# Patient Record
Sex: Female | Born: 1990 | Race: White | Hispanic: No | Marital: Married | Smoking: Never smoker
Health system: Southern US, Community
[De-identification: ages and names within clinical notes are randomized; demographics above are authoritative.]

## PROBLEM LIST (undated history)

## (undated) DIAGNOSIS — Z789 Other specified health status: Secondary | ICD-10-CM

## (undated) HISTORY — PX: THERAPEUTIC ABORTION: SHX798

## (undated) HISTORY — DX: Other specified health status: Z78.9

## (undated) HISTORY — PX: WISDOM TOOTH EXTRACTION: SHX21

---

## 2010-01-16 ENCOUNTER — Observation Stay (HOSPITAL_COMMUNITY)
Admission: AD | Admit: 2010-01-16 | Discharge: 2010-01-16 | Payer: Self-pay | Source: Home / Self Care | Admitting: Obstetrics and Gynecology

## 2010-03-02 ENCOUNTER — Inpatient Hospital Stay (HOSPITAL_COMMUNITY)
Admission: AD | Admit: 2010-03-02 | Discharge: 2010-03-04 | Payer: Self-pay | Source: Home / Self Care | Attending: Obstetrics and Gynecology | Admitting: Obstetrics and Gynecology

## 2010-05-18 LAB — CBC
HCT: 35 % — ABNORMAL LOW (ref 36.0–46.0)
HCT: 37.6 % (ref 36.0–46.0)
MCH: 28.6 pg (ref 26.0–34.0)
MCHC: 34.8 g/dL (ref 30.0–36.0)
MCV: 83.4 fL (ref 78.0–100.0)
MCV: 85 fL (ref 78.0–100.0)
Platelets: 158 10*3/uL (ref 150–400)
RBC: 4.12 MIL/uL (ref 3.87–5.11)
RDW: 12.8 % (ref 11.5–15.5)
WBC: 13.7 10*3/uL — ABNORMAL HIGH (ref 4.0–10.5)

## 2013-01-05 LAB — OB RESULTS CONSOLE GC/CHLAMYDIA
CHLAMYDIA, DNA PROBE: NEGATIVE
GC PROBE AMP, GENITAL: NEGATIVE

## 2013-01-05 LAB — OB RESULTS CONSOLE ABO/RH: RH Type: POSITIVE

## 2013-01-05 LAB — OB RESULTS CONSOLE RPR: RPR: NONREACTIVE

## 2013-01-05 LAB — OB RESULTS CONSOLE HEPATITIS B SURFACE ANTIGEN: Hepatitis B Surface Ag: NEGATIVE

## 2013-01-05 LAB — OB RESULTS CONSOLE HIV ANTIBODY (ROUTINE TESTING): HIV: NONREACTIVE

## 2013-01-05 LAB — OB RESULTS CONSOLE RUBELLA ANTIBODY, IGM: Rubella: IMMUNE

## 2013-01-05 LAB — OB RESULTS CONSOLE ANTIBODY SCREEN: ANTIBODY SCREEN: NEGATIVE

## 2013-03-08 NOTE — L&D Delivery Note (Signed)
Delivery Note At 3:18 PM a viable and healthy female was delivered via Vaginal, Spontaneous Delivery (Presentation: OA; LOT ).  APGAR: 8, 8; weight P.   Placenta status: Intact, Spontaneous.  Cord: 3 vessels with the following complications: none.    Anesthesia: Epidural  Episiotomy: none Lacerations: B labial Suture Repair: 3.0 vicryl rapide Est. Blood Loss (mL): 400cc  Mom to postpartum.  Baby to Couplet care / Skin to Skin.  Chrisoula Zegarra Bovard-Stuckert 07/02/2013, 3:35 PM  A+/Br/Contra?Idamae Lusher/RNI

## 2013-06-21 ENCOUNTER — Other Ambulatory Visit (HOSPITAL_COMMUNITY): Payer: Self-pay | Admitting: Obstetrics and Gynecology

## 2013-06-21 DIAGNOSIS — R52 Pain, unspecified: Secondary | ICD-10-CM

## 2013-06-22 ENCOUNTER — Ambulatory Visit (HOSPITAL_COMMUNITY)
Admission: RE | Admit: 2013-06-22 | Discharge: 2013-06-22 | Disposition: A | Discharge status: Home / Self Care | Payer: Medicaid Other | Source: Ambulatory Visit | Attending: Obstetrics and Gynecology | Admitting: Obstetrics and Gynecology

## 2013-06-22 DIAGNOSIS — R109 Unspecified abdominal pain: Secondary | ICD-10-CM | POA: Insufficient documentation

## 2013-06-22 DIAGNOSIS — O99891 Other specified diseases and conditions complicating pregnancy: Secondary | ICD-10-CM | POA: Insufficient documentation

## 2013-06-22 DIAGNOSIS — R52 Pain, unspecified: Secondary | ICD-10-CM

## 2013-06-22 DIAGNOSIS — O9989 Other specified diseases and conditions complicating pregnancy, childbirth and the puerperium: Principal | ICD-10-CM

## 2013-06-28 ENCOUNTER — Encounter (HOSPITAL_COMMUNITY): Payer: Self-pay | Admitting: *Deleted

## 2013-06-28 ENCOUNTER — Telehealth (HOSPITAL_COMMUNITY): Payer: Self-pay | Admitting: *Deleted

## 2013-06-28 LAB — OB RESULTS CONSOLE GBS: GBS: NEGATIVE

## 2013-06-28 NOTE — Telephone Encounter (Signed)
Preadmission screen  

## 2013-06-30 DIAGNOSIS — Z3480 Encounter for supervision of other normal pregnancy, unspecified trimester: Secondary | ICD-10-CM

## 2013-06-30 NOTE — H&P (Signed)
Ana Montoya is a 23 y.o. female G2P1001 at 39+ for IOL given term and favorable cervix. +FM< no LOF, no VB, occ ctx.  Relatively uncomplicated PNC RNI - needs MMR PP, somewhat late entry to care 14wk, [place] cwd.  +FM, no LOF, no VB, occ ctx.  D/w pt r/b/a, wishes to proceed . Maternal Medical History:  Contractions: Frequency: irregular.    Fetal activity: Perceived fetal activity is normal.    Prenatal complications: no prenatal complications Prenatal Complications - Diabetes: none.    OB History   Grav Para Term Preterm Abortions TAB SAB Ect Mult Living   3 1 1  1 1    1     G1 TAB G2 7#6, SVD female G3 presenr No abn pap No STD  Past Medical History  Diagnosis Date  . Medical history non-contributory    Past Surgical History  Procedure Laterality Date  . Wisdom tooth extraction    . Therapeutic abortion     Family History: family history is negative for Alcohol abuse, Arthritis, Asthma, Birth defects, Cancer, COPD, Depression, Diabetes, Drug abuse, Early death, Hearing loss, Heart disease, Hyperlipidemia, Hypertension, Kidney disease, Learning disabilities, Mental illness, Mental retardation, Miscarriages / Stillbirths, Stroke, Vision loss, and Varicose Veins. Social History:  reports that she has never smoked. She has never used smokeless tobacco. She reports that she does not drink alcohol or use illicit drugs.married  Meds PNV All NKDA   Prenatal Transfer Tool  Maternal Diabetes: No Genetic Screening: Normal Maternal Ultrasounds/Referrals: Normal Fetal Ultrasounds or other Referrals:  None Maternal Substance Abuse:  No Significant Maternal Medications:  None Significant Maternal Lab Results:  Lab values include: Group B Strep negative Other Comments:  None  Review of Systems  Constitutional: Negative.   HENT: Negative.   Eyes: Negative.   Respiratory: Negative.   Cardiovascular: Negative.   Gastrointestinal: Negative.   Genitourinary: Negative.    Musculoskeletal: Negative.   Skin: Negative.   Neurological: Negative.   Psychiatric/Behavioral: Negative.       Last menstrual period 10/08/2012. Maternal Exam:  Uterine Assessment: Contraction frequency is irregular.   Abdomen: Fundal height is appropriate for gestation.   Estimated fetal weight is 7.5-8.5#.   Fetal presentation: vertex  Introitus: Normal vulva. Normal vagina.  Pelvis: adequate for delivery.   Cervix: Cervix evaluated by digital exam.     Physical Exam  Constitutional: She is oriented to person, place, and time. She appears well-developed and well-nourished.  HENT:  Head: Normocephalic and atraumatic.  Cardiovascular: Normal rate and regular rhythm.   Respiratory: Effort normal and breath sounds normal. No respiratory distress. She has no wheezes.  GI: Soft. Bowel sounds are normal. She exhibits no distension. There is no tenderness.  Musculoskeletal: Normal range of motion.  Neurological: She is alert and oriented to person, place, and time.  Skin: Skin is warm and dry.  Psychiatric: She has a normal mood and affect. Her behavior is normal.    Prenatal labs: ABO, Rh: A/Positive/-- (10/31 0000) Antibody: Negative (10/31 0000) Rubella: Immune (10/31 0000) RPR: Nonreactive (10/31 0000)  HBsAg: Negative (10/31 0000)  HIV: Non-reactive (10/31 0000)  GBS: Negative (04/23 0000)   Hgb 13.9/Ur Cx neg/GC neg/ Chl neg/ RNI/Pap WNL/ AFP WNL/glucola 82/Plt 247K  Tdap 04/11/13, Flu 01/2013  Nl anat, postplac, female RUQ US WNL Assessment/Plan: 23yo G2P1001 at 39+ for IOL gbbs neg Epidural prn IOL with AROM/pitocin Expect SVD   Kiamesha Samet Bovard-Stuckert 06/30/2013, 10:37 PM

## 2013-07-02 ENCOUNTER — Inpatient Hospital Stay (HOSPITAL_COMMUNITY): Payer: Medicaid Other | Admitting: Anesthesiology

## 2013-07-02 ENCOUNTER — Encounter (HOSPITAL_COMMUNITY): Payer: Medicaid Other | Admitting: Anesthesiology

## 2013-07-02 ENCOUNTER — Inpatient Hospital Stay (HOSPITAL_COMMUNITY)
Admission: RE | Admit: 2013-07-02 | Discharge: 2013-07-03 | DRG: 775 | Disposition: A | Discharge status: Home / Self Care | Payer: Medicaid Other | Source: Ambulatory Visit | Attending: Obstetrics and Gynecology | Admitting: Obstetrics and Gynecology

## 2013-07-02 ENCOUNTER — Encounter (HOSPITAL_COMMUNITY): Payer: Self-pay

## 2013-07-02 VITALS — BP 110/73 | HR 75 | Temp 98.2°F | Resp 18 | Ht 68.0 in | Wt 181.0 lb

## 2013-07-02 DIAGNOSIS — Z3480 Encounter for supervision of other normal pregnancy, unspecified trimester: Secondary | ICD-10-CM

## 2013-07-02 LAB — CBC
HCT: 37 % (ref 36.0–46.0)
HEMOGLOBIN: 12.8 g/dL (ref 12.0–15.0)
MCH: 29.1 pg (ref 26.0–34.0)
MCHC: 34.6 g/dL (ref 30.0–36.0)
MCV: 84.1 fL (ref 78.0–100.0)
Platelets: 171 10*3/uL (ref 150–400)
RBC: 4.4 MIL/uL (ref 3.87–5.11)
RDW: 12.9 % (ref 11.5–15.5)
WBC: 9.9 10*3/uL (ref 4.0–10.5)

## 2013-07-02 LAB — RPR

## 2013-07-02 MED ORDER — LANOLIN HYDROUS EX OINT: TOPICAL_OINTMENT | CUTANEOUS | Status: DC | PRN

## 2013-07-02 MED ORDER — ACETAMINOPHEN 325 MG PO TABS: 650.0000 mg | ORAL_TABLET | ORAL | Status: DC | PRN

## 2013-07-02 MED ORDER — ONDANSETRON HCL 4 MG/2ML IJ SOLN: 4.0000 mg | Freq: Four times a day (QID) | INTRAMUSCULAR | Status: DC | PRN

## 2013-07-02 MED ORDER — OXYTOCIN 40 UNITS IN LACTATED RINGERS INFUSION - SIMPLE MED: 62.5000 mL/h | INTRAVENOUS | Status: DC

## 2013-07-02 MED ORDER — ZOLPIDEM TARTRATE 5 MG PO TABS: 5.0000 mg | ORAL_TABLET | Freq: Every evening | ORAL | Status: DC | PRN

## 2013-07-02 MED ORDER — LIDOCAINE HCL (PF) 1 % IJ SOLN
30.0000 mL | INTRAMUSCULAR | Status: DC | PRN
  Administered 2013-07-02: 30 mL via SUBCUTANEOUS
  Filled 2013-07-02: qty 30

## 2013-07-02 MED ORDER — DIBUCAINE 1 % RE OINT: 1.0000 "application " | TOPICAL_OINTMENT | RECTAL | Status: DC | PRN

## 2013-07-02 MED ORDER — LIDOCAINE HCL (PF) 1 % IJ SOLN
INTRAMUSCULAR | Status: DC | PRN
  Administered 2013-07-02 (×2): 9 mL

## 2013-07-02 MED ORDER — EPHEDRINE 5 MG/ML INJ
10.0000 mg | INTRAVENOUS | Status: DC | PRN
  Filled 2013-07-02: qty 2

## 2013-07-02 MED ORDER — EPHEDRINE 5 MG/ML INJ
10.0000 mg | INTRAVENOUS | Status: DC | PRN
  Filled 2013-07-02: qty 4
  Filled 2013-07-02: qty 2

## 2013-07-02 MED ORDER — TETANUS-DIPHTH-ACELL PERTUSSIS 5-2.5-18.5 LF-MCG/0.5 IM SUSP: 0.5000 mL | Freq: Once | INTRAMUSCULAR | Status: DC

## 2013-07-02 MED ORDER — BUTORPHANOL TARTRATE 1 MG/ML IJ SOLN
1.0000 mg | INTRAMUSCULAR | Status: DC | PRN
Start: 2013-07-02 — End: 2013-07-02

## 2013-07-02 MED ORDER — IBUPROFEN 600 MG PO TABS
600.0000 mg | ORAL_TABLET | Freq: Four times a day (QID) | ORAL | Status: DC
  Administered 2013-07-02 – 2013-07-03 (×3): 600 mg via ORAL
  Filled 2013-07-02 (×3): qty 1

## 2013-07-02 MED ORDER — OXYCODONE-ACETAMINOPHEN 5-325 MG PO TABS: 1.0000 | ORAL_TABLET | ORAL | Status: DC | PRN

## 2013-07-02 MED ORDER — MEASLES, MUMPS & RUBELLA VAC ~~LOC~~ INJ: 0.5000 mL | INJECTION | Freq: Once | SUBCUTANEOUS | Status: DC

## 2013-07-02 MED ORDER — IBUPROFEN 600 MG PO TABS
600.0000 mg | ORAL_TABLET | Freq: Four times a day (QID) | ORAL | Status: DC | PRN
  Administered 2013-07-02: 600 mg via ORAL
  Filled 2013-07-02: qty 1

## 2013-07-02 MED ORDER — PHENYLEPHRINE 40 MCG/ML (10ML) SYRINGE FOR IV PUSH (FOR BLOOD PRESSURE SUPPORT)
80.0000 ug | PREFILLED_SYRINGE | INTRAVENOUS | Status: DC | PRN
  Filled 2013-07-02: qty 2
  Filled 2013-07-02: qty 10

## 2013-07-02 MED ORDER — PRENATAL MULTIVITAMIN CH
1.0000 | ORAL_TABLET | Freq: Every day | ORAL | Status: DC
  Administered 2013-07-03: 1 via ORAL
  Filled 2013-07-02: qty 1

## 2013-07-02 MED ORDER — OXYTOCIN BOLUS FROM INFUSION: 500.0000 mL | INTRAVENOUS | Status: DC

## 2013-07-02 MED ORDER — CITRIC ACID-SODIUM CITRATE 334-500 MG/5ML PO SOLN: 30.0000 mL | ORAL | Status: DC | PRN

## 2013-07-02 MED ORDER — SENNOSIDES-DOCUSATE SODIUM 8.6-50 MG PO TABS
2.0000 | ORAL_TABLET | ORAL | Status: DC
  Administered 2013-07-02: 2 via ORAL
  Filled 2013-07-02: qty 2

## 2013-07-02 MED ORDER — PHENYLEPHRINE 40 MCG/ML (10ML) SYRINGE FOR IV PUSH (FOR BLOOD PRESSURE SUPPORT)
80.0000 ug | PREFILLED_SYRINGE | INTRAVENOUS | Status: DC | PRN
  Filled 2013-07-02: qty 2

## 2013-07-02 MED ORDER — BENZOCAINE-MENTHOL 20-0.5 % EX AERO
1.0000 "application " | INHALATION_SPRAY | CUTANEOUS | Status: DC | PRN
  Administered 2013-07-02: 1 via TOPICAL
  Filled 2013-07-02: qty 56

## 2013-07-02 MED ORDER — DIPHENHYDRAMINE HCL 25 MG PO CAPS: 25.0000 mg | ORAL_CAPSULE | Freq: Four times a day (QID) | ORAL | Status: DC | PRN

## 2013-07-02 MED ORDER — ONDANSETRON HCL 4 MG/2ML IJ SOLN: 4.0000 mg | INTRAMUSCULAR | Status: DC | PRN

## 2013-07-02 MED ORDER — TERBUTALINE SULFATE 1 MG/ML IJ SOLN: 0.2500 mg | Freq: Once | INTRAMUSCULAR | Status: DC | PRN

## 2013-07-02 MED ORDER — LACTATED RINGERS IV SOLN: INTRAVENOUS | Status: DC

## 2013-07-02 MED ORDER — FENTANYL 2.5 MCG/ML BUPIVACAINE 1/10 % EPIDURAL INFUSION (WH - ANES)
INTRAMUSCULAR | Status: DC | PRN
  Administered 2013-07-02: 14 mL/h via EPIDURAL

## 2013-07-02 MED ORDER — ONDANSETRON HCL 4 MG PO TABS: 4.0000 mg | ORAL_TABLET | ORAL | Status: DC | PRN

## 2013-07-02 MED ORDER — LACTATED RINGERS IV SOLN: 500.0000 mL | Freq: Once | INTRAVENOUS | Status: DC

## 2013-07-02 MED ORDER — WITCH HAZEL-GLYCERIN EX PADS
1.0000 | MEDICATED_PAD | CUTANEOUS | Status: DC | PRN
Start: 2013-07-02 — End: 2013-07-03

## 2013-07-02 MED ORDER — OXYTOCIN 40 UNITS IN LACTATED RINGERS INFUSION - SIMPLE MED
1.0000 m[IU]/min | INTRAVENOUS | Status: DC
  Administered 2013-07-02: 2 m[IU]/min via INTRAVENOUS
  Filled 2013-07-02: qty 1000

## 2013-07-02 MED ORDER — SIMETHICONE 80 MG PO CHEW: 80.0000 mg | CHEWABLE_TABLET | ORAL | Status: DC | PRN

## 2013-07-02 MED ORDER — FENTANYL 2.5 MCG/ML BUPIVACAINE 1/10 % EPIDURAL INFUSION (WH - ANES)
14.0000 mL/h | INTRAMUSCULAR | Status: DC | PRN
  Filled 2013-07-02: qty 125

## 2013-07-02 MED ORDER — LACTATED RINGERS IV SOLN
INTRAVENOUS | Status: DC
  Administered 2013-07-02 (×2): via INTRAVENOUS

## 2013-07-02 MED ORDER — DIPHENHYDRAMINE HCL 50 MG/ML IJ SOLN: 12.5000 mg | INTRAMUSCULAR | Status: DC | PRN

## 2013-07-02 MED ORDER — LACTATED RINGERS IV SOLN: 500.0000 mL | INTRAVENOUS | Status: DC | PRN

## 2013-07-02 NOTE — Anesthesia Procedure Notes (Signed)
Epidural Patient location during procedure: OB Start time: 07/02/2013 11:45 AM End time: 07/02/2013 11:49 AM  Staffing Anesthesiologist: Leilani AbleHATCHETT, Zerek Litsey  Preanesthetic Checklist Completed: patient identified, surgical consent, pre-op evaluation, timeout performed, IV checked, risks and benefits discussed and monitors and equipment checked  Epidural Patient position: sitting Prep: site prepped and draped and DuraPrep Patient monitoring: continuous pulse ox and blood pressure Approach: midline Location: L3-L4 Injection technique: LOR air  Needle:  Needle type: Tuohy  Needle gauge: 17 G Needle length: 9 cm and 9 Needle insertion depth: 6 cm Catheter type: closed end flexible Catheter size: 19 Gauge Catheter at skin depth: 11 cm Test dose: negative and Other  Assessment Sensory level: T9 Events: blood not aspirated, injection not painful, no injection resistance, negative IV test and no paresthesia  Additional Notes Reason for block:procedure for pain

## 2013-07-02 NOTE — Progress Notes (Signed)
Patient ID: Ana Montoya, female   DOB: [birth year], [age] y.o.   MRN: [id number]  No c/o's, comfortable with epidural  AFVSS gen NAD FHTs 120's category 1 toco Q 2-553min  SVE per RN 4/70  23yo G2P1001 at 39+ for IOL Continue IOL - pitocin

## 2013-07-02 NOTE — Progress Notes (Signed)
Patient ID: Ana Montoya, female   DOB: 30-Dec-1990, 23 y.o.   MRN: [id number]  No c/o's.  Ready for baby!  AFVSS  gen NAD FHTs 120's, category 1 toco occ, irr  SVE 3/50/-2  AROM for clear fluid w/o diff/comp  23yo G3P1011 at 39+ for IOL Pitocin and AROM,  Expect SVD Will await pitocin if no change in 2 hr

## 2013-07-02 NOTE — Anesthesia Preprocedure Evaluation (Signed)
Anesthesia Evaluation  Patient identified by MRN, date of birth, ID band Patient awake    Reviewed: Allergy & Precautions, H&P , NPO status , Patient's Chart, lab work & pertinent test results  Airway Mallampati: I TM Distance: >3 FB Neck ROM: full    Dental no notable dental hx.    Pulmonary neg pulmonary ROS,    Pulmonary exam normal       Cardiovascular negative cardio ROS  Rhythm:regular     Neuro/Psych negative neurological ROS  negative psych ROS   GI/Hepatic negative GI ROS, Neg liver ROS,   Endo/Other  negative endocrine ROS  Renal/GU negative Renal ROS     Musculoskeletal   Abdominal Normal abdominal exam  (+)   Peds  Hematology negative hematology ROS (+)   Anesthesia Other Findings   Reproductive/Obstetrics (+) Pregnancy                           Anesthesia Physical Anesthesia Plan  ASA: II  Anesthesia Plan: Epidural   Post-op Pain Management:    Induction:   Airway Management Planned:   Additional Equipment:   Intra-op Plan:   Post-operative Plan:   Informed Consent: I have reviewed the patients History and Physical, chart, labs and discussed the procedure including the risks, benefits and alternatives for the proposed anesthesia with the patient or authorized representative who has indicated his/her understanding and acceptance.     Plan Discussed with:   Anesthesia Plan Comments:         Anesthesia Quick Evaluation

## 2013-07-03 LAB — CBC
HCT: 34 % — ABNORMAL LOW (ref 36.0–46.0)
Hemoglobin: 11.6 g/dL — ABNORMAL LOW (ref 12.0–15.0)
MCH: 28.9 pg (ref 26.0–34.0)
MCHC: 34.1 g/dL (ref 30.0–36.0)
MCV: 84.6 fL (ref 78.0–100.0)
PLATELETS: 158 10*3/uL (ref 150–400)
RBC: 4.02 MIL/uL (ref 3.87–5.11)
RDW: 12.9 % (ref 11.5–15.5)
WBC: 11.9 10*3/uL — ABNORMAL HIGH (ref 4.0–10.5)

## 2013-07-03 MED ORDER — IBUPROFEN 800 MG PO TABS: 800.0000 mg | ORAL_TABLET | Freq: Three times a day (TID) | ORAL | Status: AC | PRN

## 2013-07-03 MED ORDER — OXYCODONE-ACETAMINOPHEN 5-325 MG PO TABS: 1.0000 | ORAL_TABLET | Freq: Four times a day (QID) | ORAL | Status: AC | PRN

## 2013-07-03 MED ORDER — PRENATAL MULTIVITAMIN CH: 1.0000 | ORAL_TABLET | Freq: Every day | ORAL | Status: AC

## 2013-07-03 NOTE — Lactation Note (Signed)
This note was copied from the chart of Ana Eda PaschalKelly Stokke. Lactation Consultation Note  Patient Name: Ana Montoya [id number]'s Date: 07/03/2013 Reason for consult: Follow-up assessment;Other (Comment) (Checked latch while baby nursing.) Follow-up assessment of latch. Baby already nursing. Baby's latch appears shallow at first, but both lips are flanged outward, baby has good rhythmic sucking with swallows heard. Mom denies soreness. Enc mom to make sure baby's lower lip is flanged outward and baby has as deep a latch as possible. Enc mom to re-latch if baby slips to the tip. Enc mom to call for assistance as needed.  Maternal Data Formula Feeding for Exclusion: No Has patient been taught Hand Expression?: Yes (Mom reports knowing how.  She was able to explain it.to [facility] Does the patient have breastfeeding experience prior to this delivery?: Yes  Feeding Feeding Type: Breast Fed  LATCH Score/Interventions                      Lactation Tools Discussed/Used     Consult Status Consult Status: Complete Date: 07/04/13    Sherlyn HayJennifer D Chao Blazejewski 07/03/2013, 2:19 PM

## 2013-07-03 NOTE — Progress Notes (Addendum)
Post Partum Day 1 Subjective: no complaints, up ad lib, tolerating PO and nl lochia, pain controlled  Objective: Blood pressure 110/73, pulse 75, temperature 98.2 F (36.8 C), temperature source Oral, resp. rate 18, height 5\' 8"  (1.727 m), weight 82.101 kg (181 lb), last menstrual period 10/08/2012, SpO2 88.00%, unknown if currently breastfeeding.  Physical Exam:  General: alert and no distress Lochia: appropriate Uterine Fundus: firm   Recent Labs  07/02/13 0754 07/03/13 0550  HGB 12.8 11.6*  HCT 37.0 34.0*    Assessment/Plan: Plan for discharge tomorrow, pt desires d/c today, Breastfeeding and Lactation consult.  Routine PP care.  D/C with motrin, percocet, and pnv; f/u 6 weeks.     LOS: 1 day   Annalissa Murphey Bovard-Stuckert 07/03/2013, 7:29 AM

## 2013-07-03 NOTE — Discharge Summary (Signed)
Obstetric Discharge Summary Reason for Admission: induction of labor Prenatal Procedures: none Intrapartum Procedures: spontaneous vaginal delivery Postpartum Procedures: Rubella Ig and Tdap Complications-Operative and Postpartum: B labial laceration Hemoglobin  Date Value Ref Range Status  07/03/2013 11.6* 12.0 - 15.0 g/dL Final     HCT  Date Value Ref Range Status  07/03/2013 34.0* 36.0 - 46.0 % Final    Physical Exam:  General: alert and no distress Lochia: appropriate Uterine Fundus: firm  Discharge Diagnoses: Term Pregnancy-delivered  Discharge Information: Date: 07/03/2013 Activity: pelvic rest Diet: routine Medications: PNV, Ibuprofen and Percocet Condition: stable Instructions: refer to practice specific booklet Discharge to: home Follow-up Information   Follow up with Bovard-Stuckert, Aubriee Szeto, MD. Schedule an appointment as soon as possible for a visit in 6 weeks. (for routine pp care)    Specialty:  Obstetrics and Gynecology   Contact information:   [address] [place] [address] [phone]       Newborn Data: Live born female  Birth Weight: 8 lb 12.7 oz (3990 g) APGAR: 8, 8  Home with mother.  Ana Montoya Bovard-Stuckert 07/03/2013, 7:52 AM

## 2013-07-03 NOTE — Progress Notes (Signed)
UR chart review completed.  

## 2014-01-07 ENCOUNTER — Encounter (HOSPITAL_COMMUNITY): Payer: Self-pay

## 2016-01-13 IMAGING — US US ABDOMEN COMPLETE
1 series · 14 of 25 positions shown · non-contrast
Comparison: None.

CLINICAL DATA: Pain, 38 weeks pregnant

EXAM:
ULTRASOUND ABDOMEN COMPLETE

[Series 1: us abdomen complete · 0.27mm/px · 14 of 65 slices shown]
[im 1/65]
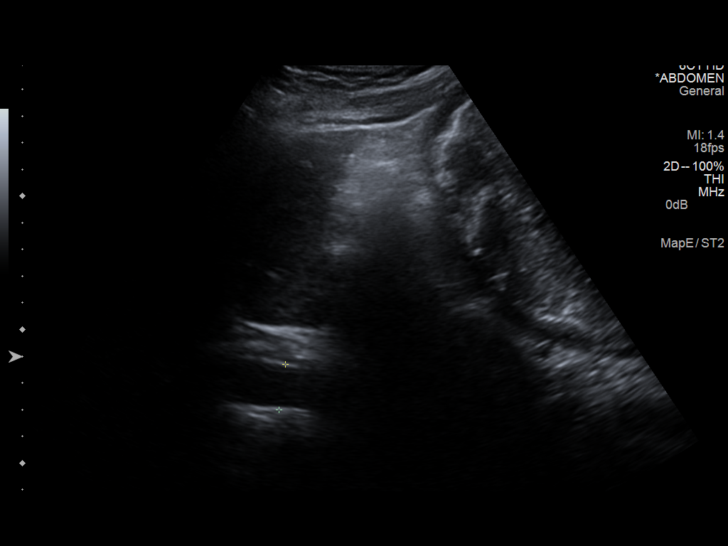
[im 6/65]
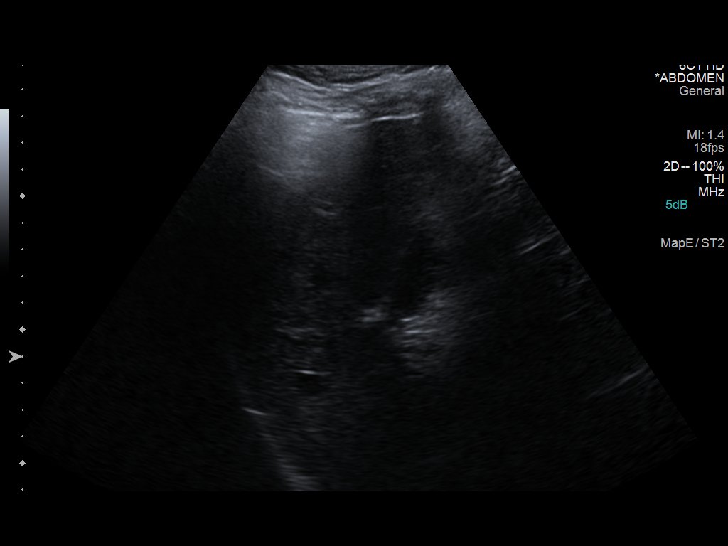
[im 11/65]
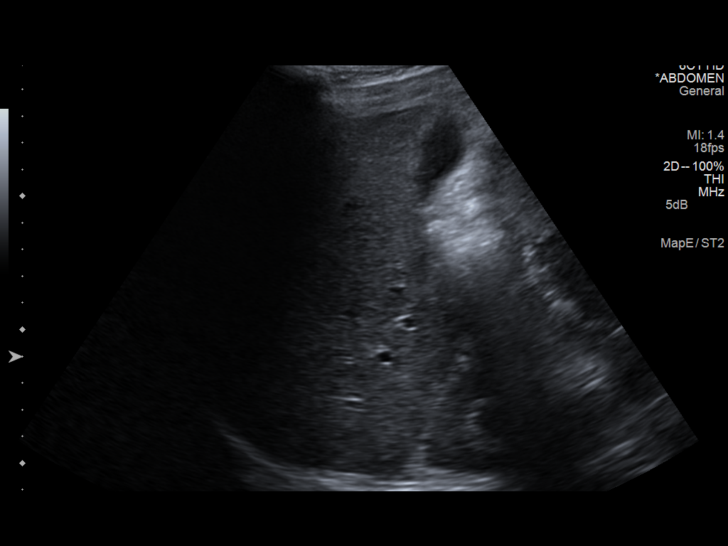
[im 17/65]
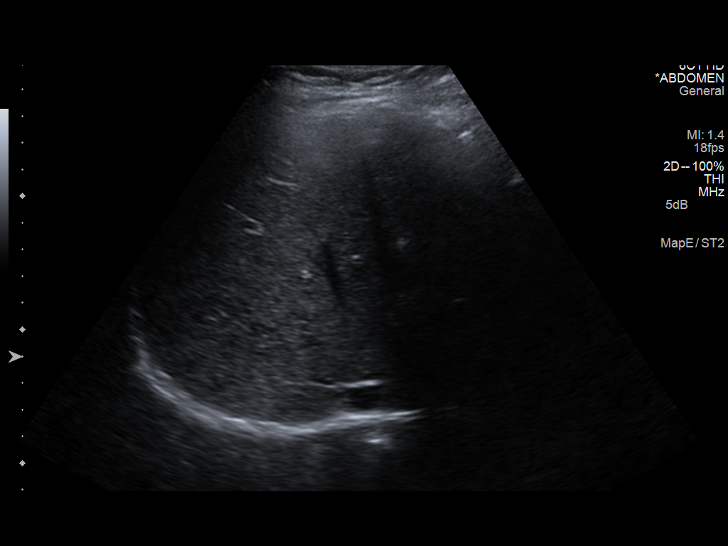
[im 22/65]
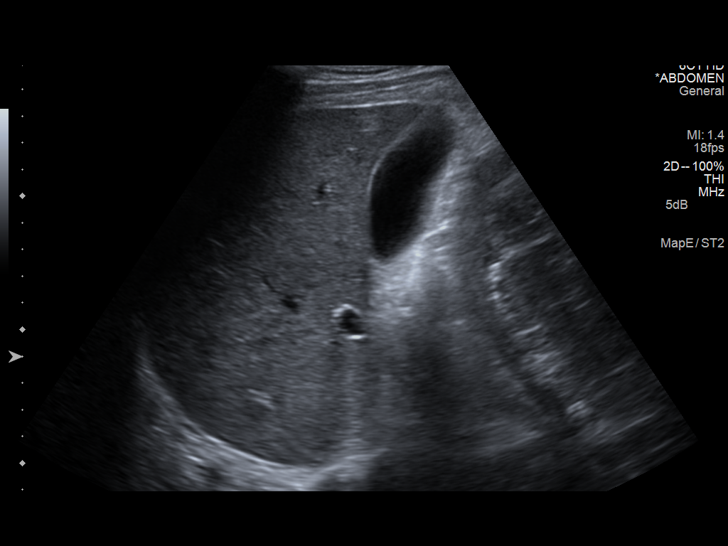
[im 25/65]
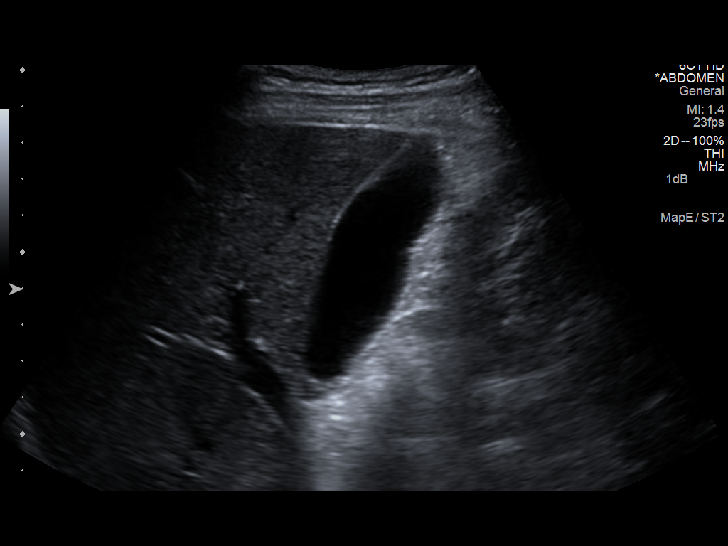
[im 30/65]
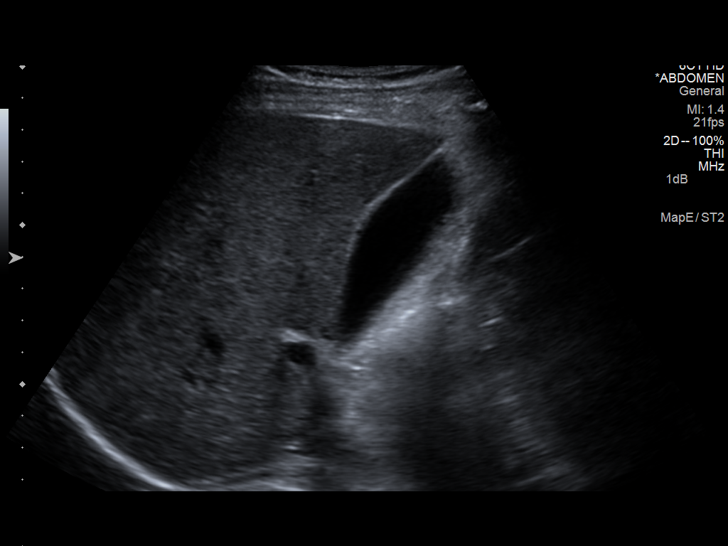
[im 35/65]
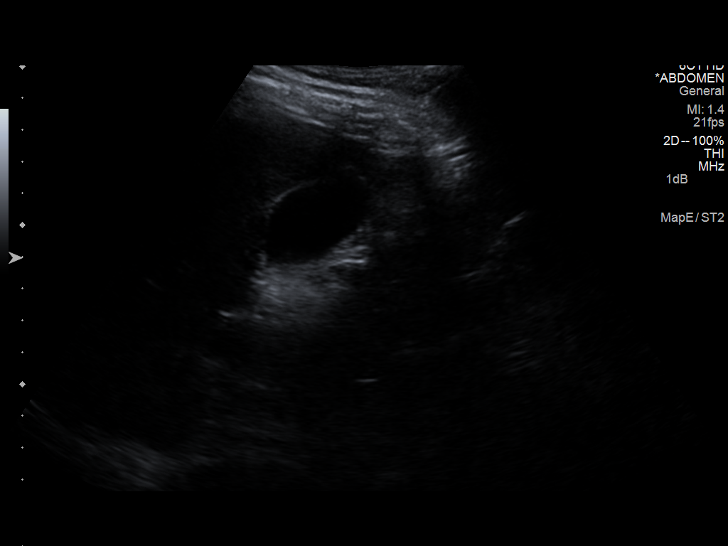
[im 41/65]
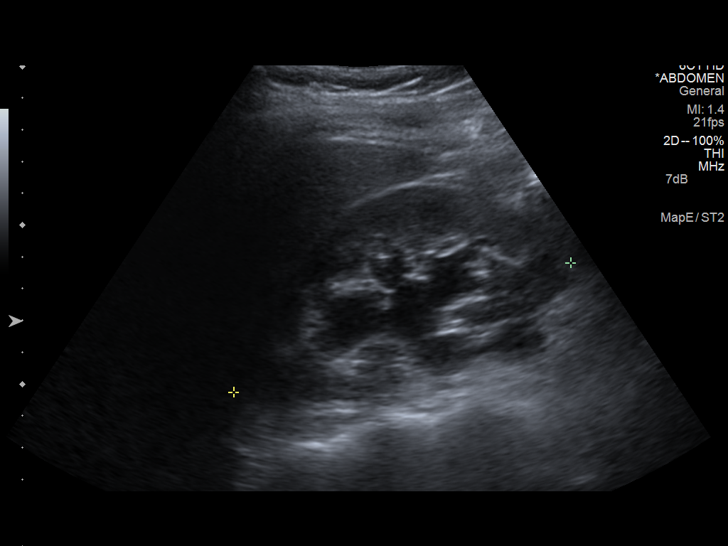
[im 43/65]
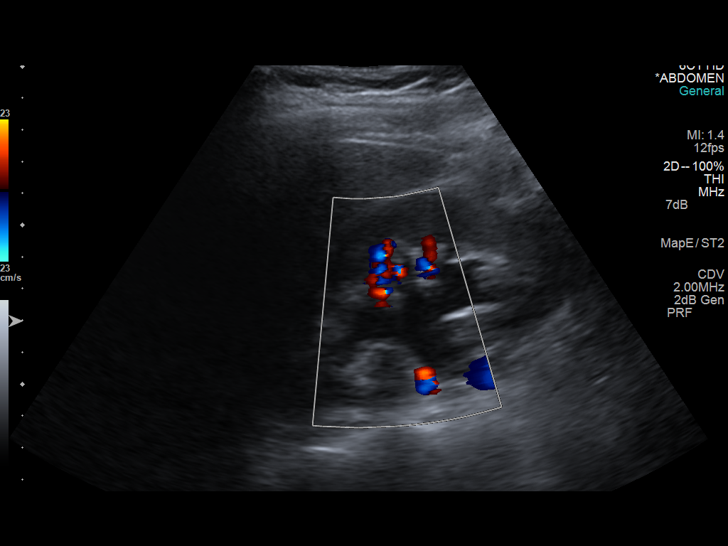
[im 49/65]
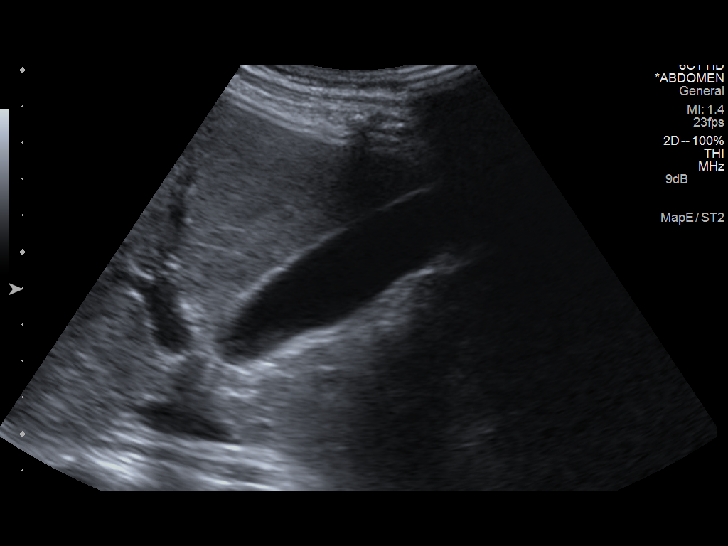
[im 54/65]
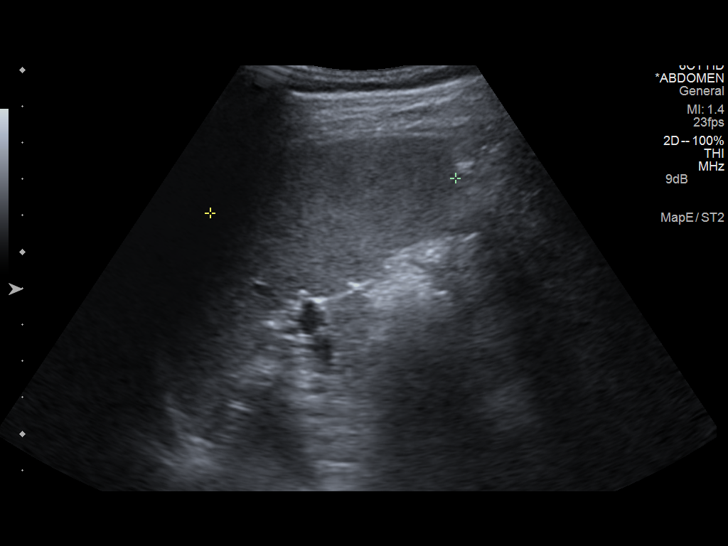
[im 59/65]
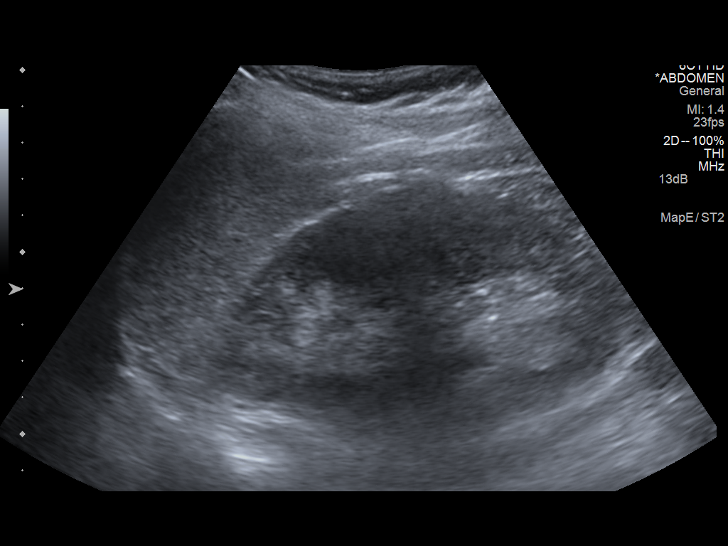
[im 65/65]
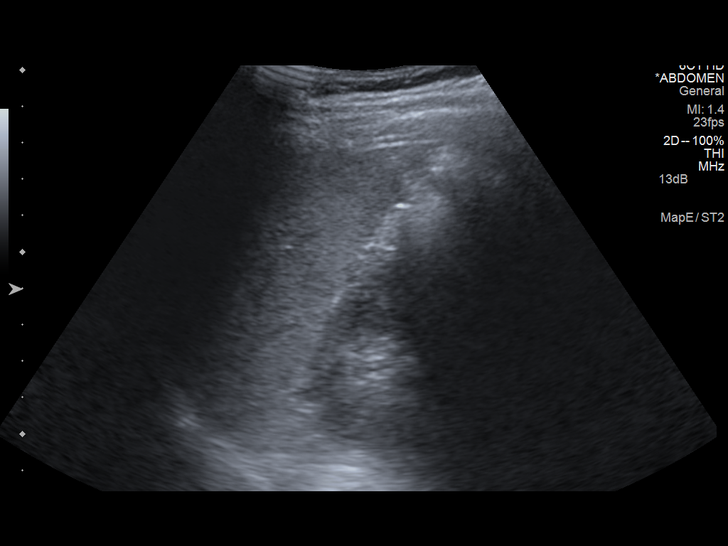

[14 of 25 positions shown; findings below may reference images not displayed]

FINDINGS: Gallbladder:

No gallstones or wall thickening visualized. No sonographic Murphy
sign noted.

Common bile duct:

Diameter: 4 mm in diameter within normal limits.

Liver:

No focal lesion identified. Within normal limits in parenchymal
echogenicity.

IVC:

No abnormality visualized.

Pancreas:

Not seen due to bowel gas

Spleen:

Size and appearance within normal limits. Measures 6.8 cm in length.

Right Kidney:

Length: 11.4 cm. Echogenicity within normal limits. Mild
hydronephrosis. No diagnostic renal calculus

Left Kidney:

Length: 11.4 cm in length. Echogenicity within normal limits. No
mass or hydronephrosis visualized.

Abdominal aorta:

No aneurysm visualized.  Measures up to 1.7 cm in diameter.

Other findings:

None.
IMPRESSION: 1. No gallstones are noted within gallbladder.  Normal CBD.
2. No intrahepatic biliary ductal dilatation. No focal hepatic mass.
3. Mild right hydronephrosis without diagnostic renal calculus. No
left hydronephrosis.
4. No aortic aneurysm.
